# Patient Record
Sex: Female | Born: 1985
Health system: Southern US, Community
[De-identification: ages and names within clinical notes are randomized; demographics above are authoritative.]

## PROBLEM LIST (undated history)

## (undated) DIAGNOSIS — T7840XA Allergy, unspecified, initial encounter: Secondary | ICD-10-CM

## (undated) DIAGNOSIS — B019 Varicella without complication: Secondary | ICD-10-CM

## (undated) DIAGNOSIS — J302 Other seasonal allergic rhinitis: Secondary | ICD-10-CM

## (undated) DIAGNOSIS — R011 Cardiac murmur, unspecified: Secondary | ICD-10-CM

## (undated) DIAGNOSIS — F32A Depression, unspecified: Secondary | ICD-10-CM

## (undated) DIAGNOSIS — F329 Major depressive disorder, single episode, unspecified: Secondary | ICD-10-CM

## (undated) DIAGNOSIS — G44009 Cluster headache syndrome, unspecified, not intractable: Secondary | ICD-10-CM

## (undated) HISTORY — DX: Cluster headache syndrome, unspecified, not intractable: G44.009

## (undated) HISTORY — DX: Depression, unspecified: F32.A

## (undated) HISTORY — DX: Cardiac murmur, unspecified: R01.1

## (undated) HISTORY — PX: TONSILLECTOMY: SUR1361

## (undated) HISTORY — DX: Major depressive disorder, single episode, unspecified: F32.9

## (undated) HISTORY — DX: Varicella without complication: B01.9

## (undated) HISTORY — DX: Other seasonal allergic rhinitis: J30.2

## (undated) HISTORY — DX: Allergy, unspecified, initial encounter: T78.40XA

---

## 2008-09-20 HISTORY — PX: THERAPEUTIC ABORTION: SHX798

## 2010-04-29 ENCOUNTER — Ambulatory Visit: Payer: Self-pay | Admitting: Obstetrics & Gynecology

## 2010-04-29 LAB — CONVERTED CEMR LAB
HCV Ab: NEGATIVE
Hepatitis B Surface Ag: NEGATIVE

## 2010-04-30 ENCOUNTER — Encounter: Payer: Self-pay | Admitting: Obstetrics & Gynecology

## 2010-04-30 LAB — CONVERTED CEMR LAB
Trich, Wet Prep: NONE SEEN
Yeast Wet Prep HPF POC: NONE SEEN

## 2010-05-12 ENCOUNTER — Ambulatory Visit: Payer: Self-pay | Admitting: Otolaryngology

## 2011-02-02 NOTE — Assessment & Plan Note (Signed)
NAMELORENIA, Erica Bryan NO.:  [id number]   MEDICAL RECORD NO.:  [id number]          PATIENT TYPE:  POB   LOCATION:  [facility] at [facility]         FACILITY:  [facility]   PHYSICIAN:  Jaynie Collins, MD     DATE OF BIRTH:  May 18, 1986   DATE OF SERVICE:  04/29/2010                                  CLINIC NOTE   REASON FOR VISIT:  Annual gynecological exam.   HISTORY:  Ms. Rada is a 25 year old, gravida 1, para 0-0-1-0, who is  here today to establish gynecologic care and get her annual examination.  The patient has no gynecologic concerns.  She denies any abnormal  bleeding, vaginal discharge, pelvic pain, or any other medical concerns.   PAST OB/GYN HISTORY:  The patient had 1 termination in January 2010.   MENSTRUAL HISTORY:  She had menarche at age 63.  Her periods are regular  with 25 days in between her cycles.  Her periods last for 5 days with  some medium flow and mild pain.  No intermenstrual bleeding.  Her last  menstrual period was on April 23, 2010.  The patient currently uses  condoms for contraception and sexually transmitted infection prevention.  She has not gotten the Gardasil vaccination series, but denies any  history of abnormal Pap smears or sexually transmitted infections.   PAST MEDICAL HISTORY:  None.   PAST SURGICAL HISTORY:  Pregnancy termination.   MEDICATIONS:  None.   ALLERGIES:  SULFA.  The patient is not sure of the reaction to this  drug.   SOCIAL HISTORY:  The patient works as a Merchandiser, retail at [employer].  She  lives with her father.  She denies any tobacco, alcohol, or illicit drug  use.  She denies any past or current history of physical or sexual  abuse.   FAMILY HISTORY:  Unremarkable for diabetes and heart disease.  Her  mother also was diagnosed with stomach cancer.  She denies any cancer of  the breast, colon, ovaries, or uterus.   REVIEW OF SYSTEMS:  Remarkable for headache and cough which are caused  by seasonal allergies  and vaginal discharge, the patient reports having  a yellow greenish discharge for a few weeks that she notes  intermittently.   PHYSICAL EXAMINATION:  VITAL SIGNS:  Pulse is 96, blood pressure is  118/83, weight 140 pounds, and height 5 feet 6 inches.  GENERAL:  No apparent distress.  HEENT:  Normocephalic and atraumatic.  LUNGS:  Clear to auscultation bilaterally.  HEART:  Regular rate and rhythm.  NECK:  Supple.  Normal thyroid.  No masses.  ABDOMEN:  Soft, nontender, nondistended.  BREASTS:  Symmetric in size, soft, nontender.  No abnormal masses, skin  changes, nipple drainage, or lymphadenopathy noted.  EXTREMITIES:  No  cyanosis, clubbing, or edema.  PELVIC:  Normal external female genitalia, pink and well rugated vagina  normal, cervical contour.  Pap smear was obtained.  The patient did have  scant vaginal discharge, a sample was taken for wet prep.  On bimanual  exam, the patient has an anteverted and anteflexed, mobile uterus,  normal adnexa bilaterally.  Mild uterine tenderness on examination but  no cervical motion  tenderness or adnexal tenderness.   ASSESSMENT AND PLAN:  The patient is a 25 year old, gravida 1, para 0-0-  1-0, here for annual examination.  A Pap smear was obtained.  The  patient is interested in screening for sexually transmitted infections  and gonorrhea and Chlamydia will be screened from a Pap smear sample and  a wet prep will also be screened for Trichomonas.  She will have serum  tested for HIV, hep B, hep C, syphilis and will follow up on these  results.  The patient was informed about the Gardasil series.  She will  think about this and make an appointment to get this series as soon as  possible.  As for contraception, the patient wanted a prescription for  Loestrin 24 FE which was given to our she has used this in the past.  The patient was told to call or come back in for any further gynecologic  concerns.            ______________________________  Jaynie Collins, MD     UA/MEDQ  D:  04/29/2010  T:  04/30/2010  Job:  [id number]

## 2011-07-01 ENCOUNTER — Encounter: Payer: Self-pay | Admitting: Family Medicine

## 2011-07-01 ENCOUNTER — Ambulatory Visit (INDEPENDENT_AMBULATORY_CARE_PROVIDER_SITE_OTHER): Payer: BC Managed Care – PPO | Admitting: Family Medicine

## 2011-07-01 DIAGNOSIS — Z1272 Encounter for screening for malignant neoplasm of vagina: Secondary | ICD-10-CM

## 2011-07-01 DIAGNOSIS — IMO0001 Reserved for inherently not codable concepts without codable children: Secondary | ICD-10-CM | POA: Insufficient documentation

## 2011-07-01 DIAGNOSIS — Z113 Encounter for screening for infections with a predominantly sexual mode of transmission: Secondary | ICD-10-CM

## 2011-07-01 DIAGNOSIS — Z309 Encounter for contraceptive management, unspecified: Secondary | ICD-10-CM

## 2011-07-01 DIAGNOSIS — Z01419 Encounter for gynecological examination (general) (routine) without abnormal findings: Secondary | ICD-10-CM

## 2011-07-01 MED ORDER — NORETHIN ACE-ETH ESTRAD-FE 1-20 MG-MCG(24) PO TABS: 1.0000 | ORAL_TABLET | Freq: Every day | ORAL | Status: DC

## 2011-07-01 NOTE — Progress Notes (Signed)
  Subjective:     Erica Bryan is a 25 y.o. female and is here for a comprehensive physical exam. The patient reports no problems.  History   Social History  . Marital Status: Single    Spouse Name: N/A    Number of Children: N/A  . Years of Education: N/A   Occupational History  . Not on file.   Social History Main Topics  . Smoking status: Never Smoker   . Smokeless tobacco: Not on file  . Alcohol Use: No  . Drug Use: No  . Sexually Active: Yes    Birth Control/ Protection: Pill   Other Topics Concern  . Not on file   Social History Narrative  . No narrative on file   Health Maintenance  Topic Date Due  . Pap Smear  09/26/2002  . Tetanus/tdap  09/27/2003  . Influenza Vaccine  06/21/2011    The following portions of the patient's history were reviewed and updated as appropriate: allergies, current medications, past family history, past medical history, past social history, past surgical history and problem list.  Review of Systems A comprehensive review of systems was negative.   Objective:    BP 119/81  Pulse 101  Ht 5\' 5"  (1.651 m)  Wt 144 lb (65.318 kg)  BMI 23.96 kg/m2  LMP 06/15/2011 General appearance: alert, cooperative and appears stated age Head: Normocephalic, without obvious abnormality, atraumatic Neck: no adenopathy, supple, symmetrical, trachea midline and thyroid not enlarged, symmetric, no tenderness/mass/nodules Lungs: clear to auscultation bilaterally Breasts: normal appearance, no masses or tenderness, fibrocystic change at 12 o'clock left nipple. Heart: regular rate and rhythm, S1, S2 normal, no murmur, click, rub or gallop Abdomen: soft, non-tender; bowel sounds normal; no masses,  no organomegaly Pelvic: cervix normal in appearance, external genitalia normal, no adnexal masses or tenderness, no cervical motion tenderness, uterus normal size, shape, and consistency and vagina normal without discharge Extremities: extremities normal,  atraumatic, no cyanosis or edema Pulses: 2+ and symmetric Skin: Skin color, texture, turgor normal. No rashes or lesions Lymph nodes: Cervical, supraclavicular, and axillary nodes normal. Neurologic: Grossly normal    Assessment:    Healthy female exam.      Plan:    Pap smear Refill OC's. GC/Chlam See After Visit Summary for Counseling Recommendations

## 2011-07-01 NOTE — Progress Notes (Signed)
Patient here for physical and needs refill of Lo estrin.  She declines flu vaccine today.

## 2011-07-01 NOTE — Patient Instructions (Signed)

## 2012-07-11 ENCOUNTER — Ambulatory Visit (INDEPENDENT_AMBULATORY_CARE_PROVIDER_SITE_OTHER): Payer: BC Managed Care – PPO | Admitting: Obstetrics & Gynecology

## 2012-07-11 ENCOUNTER — Encounter: Payer: Self-pay | Admitting: Obstetrics & Gynecology

## 2012-07-11 VITALS — BP 112/86 | HR 110 | Ht 65.0 in | Wt 153.0 lb

## 2012-07-11 DIAGNOSIS — Z01419 Encounter for gynecological examination (general) (routine) without abnormal findings: Secondary | ICD-10-CM

## 2012-07-11 DIAGNOSIS — Z1151 Encounter for screening for human papillomavirus (HPV): Secondary | ICD-10-CM

## 2012-07-11 DIAGNOSIS — Z0000 Encounter for general adult medical examination without abnormal findings: Secondary | ICD-10-CM

## 2012-07-11 DIAGNOSIS — Z113 Encounter for screening for infections with a predominantly sexual mode of transmission: Secondary | ICD-10-CM

## 2012-07-11 DIAGNOSIS — Z124 Encounter for screening for malignant neoplasm of cervix: Secondary | ICD-10-CM

## 2012-07-11 MED ORDER — NORETHIN ACE-ETH ESTRAD-FE 1-20 MG-MCG(24) PO CHEW: 1.0000 | CHEWABLE_TABLET | Freq: Every morning | ORAL | Status: DC

## 2012-07-11 NOTE — Progress Notes (Signed)
Subjective:    Erica Bryan is a 26 y.o. female who presents for an annual exam. The patient has no complaints today. She wants a prescription for Minastrin. The patient is sexually active. GYN screening history: last pap: was normal. The patient wears seatbelts: yes. The patient participates in regular exercise: yes. Has the patient ever been transfused or tattooed?: yes. The patient reports that there is not domestic violence in her life.   Menstrual History: OB History    Grav Para Term Preterm Abortions TAB SAB Ect Mult Living   1    1           Menarche age: 89 Patient's last menstrual period was 06/20/2012.    The following portions of the patient's history were reviewed and updated as appropriate: allergies, current medications, past family history, past medical history, past social history, past surgical history and problem list.  Review of Systems A comprehensive review of systems was negative.  She is a Production designer, theatre/television/film at [employer]. She has been monogamous for 10 years and lives with her boyfriend. She declines a flu shot today. She denies dyspareunia.   Objective:    BP 112/86  Pulse 110  Ht 5\' 5"  (1.651 m)  Wt 153 lb (69.4 kg)  BMI 25.46 kg/m2  LMP 06/20/2012  General Appearance:    Alert, cooperative, no distress, appears stated age  Head:    Normocephalic, without obvious abnormality, atraumatic  Eyes:    PERRL, conjunctiva/corneas clear, EOM's intact, fundi    benign, both eyes  Ears:    Normal TM's and external ear canals, both ears  Nose:   Nares normal, septum midline, mucosa normal, no drainage    or sinus tenderness  Throat:   Lips, mucosa, and tongue normal; teeth and gums normal  Neck:   Supple, symmetrical, trachea midline, no adenopathy;    thyroid:  no enlargement/tenderness/nodules; no carotid   bruit or JVD  Back:     Symmetric, no curvature, ROM normal, no CVA tenderness  Lungs:     Clear to auscultation bilaterally, respirations unlabored  Chest Wall:     No tenderness or deformity   Heart:    Regular rate and rhythm, S1 and S2 normal, no murmur, rub   or gallop  Breast Exam:    No tenderness, masses, or nipple abnormality  Abdomen:     Soft, non-tender, bowel sounds active all four quadrants,    no masses, no organomegaly  Genitalia:    Normal female without lesion, discharge or tenderness, shaved, no lesions, NSSA, NT, mobile, no adnexal masses or tenderness     Extremities:   Extremities normal, atraumatic, no cyanosis or edema  Pulses:   2+ and symmetric all extremities  Skin:   Skin color, texture, turgor normal, no rashes or lesions  Lymph nodes:   Cervical, supraclavicular, and axillary nodes normal  Neurologic:   CNII-XII intact, normal strength, sensation and reflexes    throughout  .    Assessment:    Healthy female exam.    Plan:     Thin prep Pap smear.  I have recommended that she start a MVI as she may want a pregnancy in the near future.

## 2013-07-18 ENCOUNTER — Telehealth: Payer: Self-pay | Admitting: *Deleted

## 2013-07-18 DIAGNOSIS — IMO0001 Reserved for inherently not codable concepts without codable children: Secondary | ICD-10-CM

## 2013-07-18 MED ORDER — NORETHIN ACE-ETH ESTRAD-FE 1-20 MG-MCG(24) PO CHEW: 1.0000 | CHEWABLE_TABLET | Freq: Every morning | ORAL | Status: DC

## 2013-07-18 NOTE — Telephone Encounter (Signed)
Pharmacy is requesting refill of ocp.  Ok to refill two times and no further refills will be given as patient is due for her yearly exam.

## 2013-10-31 ENCOUNTER — Telehealth: Payer: Self-pay | Admitting: *Deleted

## 2013-10-31 DIAGNOSIS — IMO0001 Reserved for inherently not codable concepts without codable children: Secondary | ICD-10-CM

## 2013-10-31 MED ORDER — NORETHIN ACE-ETH ESTRAD-FE 1-20 MG-MCG(24) PO CHEW: 1.0000 | CHEWABLE_TABLET | Freq: Every morning | ORAL | Status: DC

## 2013-10-31 NOTE — Telephone Encounter (Signed)
Patient given one refill.  She needs to make appointment before further refills will be given.

## 2013-11-06 ENCOUNTER — Ambulatory Visit: Payer: BC Managed Care – PPO | Admitting: Family Medicine

## 2014-07-22 ENCOUNTER — Encounter: Payer: Self-pay | Admitting: Obstetrics & Gynecology

## 2017-08-10 ENCOUNTER — Ambulatory Visit: Payer: 59 | Admitting: Internal Medicine

## 2017-08-10 ENCOUNTER — Encounter: Payer: Self-pay | Admitting: Internal Medicine

## 2017-08-10 VITALS — BP 112/74 | HR 94 | Temp 98.5°F | Ht 66.0 in | Wt 183.4 lb

## 2017-08-10 DIAGNOSIS — Z13220 Encounter for screening for lipoid disorders: Secondary | ICD-10-CM | POA: Diagnosis not present

## 2017-08-10 DIAGNOSIS — R51 Headache: Secondary | ICD-10-CM | POA: Diagnosis not present

## 2017-08-10 DIAGNOSIS — H538 Other visual disturbances: Secondary | ICD-10-CM | POA: Diagnosis not present

## 2017-08-10 DIAGNOSIS — Z9109 Other allergy status, other than to drugs and biological substances: Secondary | ICD-10-CM | POA: Diagnosis not present

## 2017-08-10 DIAGNOSIS — F320 Major depressive disorder, single episode, mild: Secondary | ICD-10-CM | POA: Diagnosis not present

## 2017-08-10 DIAGNOSIS — Z1159 Encounter for screening for other viral diseases: Secondary | ICD-10-CM

## 2017-08-10 DIAGNOSIS — R519 Headache, unspecified: Secondary | ICD-10-CM | POA: Insufficient documentation

## 2017-08-10 HISTORY — DX: Major depressive disorder, single episode, mild: F32.0

## 2017-08-10 LAB — CBC WITH DIFFERENTIAL/PLATELET
BASOS ABS: 0 10*3/uL (ref 0.0–0.1)
Basophils Relative: 0.5 % (ref 0.0–3.0)
EOS ABS: 0.1 10*3/uL (ref 0.0–0.7)
Eosinophils Relative: 2.2 % (ref 0.0–5.0)
HEMATOCRIT: 40.1 % (ref 36.0–46.0)
HEMOGLOBIN: 13.5 g/dL (ref 12.0–15.0)
LYMPHS PCT: 25.6 % (ref 12.0–46.0)
Lymphs Abs: 1.2 10*3/uL (ref 0.7–4.0)
MCHC: 33.7 g/dL (ref 30.0–36.0)
MCV: 89.2 fl (ref 78.0–100.0)
MONO ABS: 0.2 10*3/uL (ref 0.1–1.0)
Monocytes Relative: 4.6 % (ref 3.0–12.0)
Neutro Abs: 3.1 10*3/uL (ref 1.4–7.7)
Neutrophils Relative %: 67.1 % (ref 43.0–77.0)
Platelets: 181 10*3/uL (ref 150.0–400.0)
RBC: 4.49 Mil/uL (ref 3.87–5.11)
RDW: 12.7 % (ref 11.5–15.5)
WBC: 4.6 10*3/uL (ref 4.0–10.5)

## 2017-08-10 LAB — COMPREHENSIVE METABOLIC PANEL
ALBUMIN: 4.5 g/dL (ref 3.5–5.2)
ALK PHOS: 53 U/L (ref 39–117)
ALT: 10 U/L (ref 0–35)
AST: 14 U/L (ref 0–37)
BILIRUBIN TOTAL: 0.7 mg/dL (ref 0.2–1.2)
BUN: 8 mg/dL (ref 6–23)
CALCIUM: 9.4 mg/dL (ref 8.4–10.5)
CO2: 28 mEq/L (ref 19–32)
CREATININE: 0.59 mg/dL (ref 0.40–1.20)
Chloride: 104 mEq/L (ref 96–112)
GFR: 124.86 mL/min (ref >60.00)
Glucose, Bld: 99 mg/dL (ref 70–99)
Potassium: 3.8 mEq/L (ref 3.5–5.1)
Sodium: 139 mEq/L (ref 135–145)
TOTAL PROTEIN: 6.8 g/dL (ref 6.0–8.3)

## 2017-08-10 LAB — LIPID PANEL
CHOLESTEROL: 166 mg/dL (ref 0–200)
HDL: 65.8 mg/dL (ref >39.00)
LDL Cholesterol: 88 mg/dL (ref 0–99)
NONHDL: 99.93
Total CHOL/HDL Ratio: 3
Triglycerides: 60 mg/dL (ref 0.0–149.0)
VLDL: 12 mg/dL (ref 0.0–40.0)

## 2017-08-10 LAB — TSH: TSH: 1.34 u[IU]/mL (ref 0.35–4.50)

## 2017-08-10 LAB — T4, FREE: FREE T4: 0.88 ng/dL (ref 0.60–1.60)

## 2017-08-10 MED ORDER — BUTALBITAL-APAP-CAFFEINE 50-325-40 MG PO TABS: 1.0000 | ORAL_TABLET | Freq: Two times a day (BID) | ORAL | 0 refills | Status: AC | PRN

## 2017-08-10 MED ORDER — ONDANSETRON HCL 4 MG PO TABS: 4.0000 mg | ORAL_TABLET | Freq: Three times a day (TID) | ORAL | 0 refills | Status: DC | PRN

## 2017-08-10 MED ORDER — NORTRIPTYLINE HCL 10 MG PO CAPS: 10.0000 mg | ORAL_CAPSULE | Freq: Every day | ORAL | 1 refills | Status: DC

## 2017-08-10 NOTE — Progress Notes (Signed)
Chief Complaint  Patient presents with  . Establish Care  . Headache  . Depression   New patient here with best friend of 20+ years female  1. C/o depression since age 31 y.o PHQ 9 score 5 today. No meds tried  2. C/o migraines x 4-5 years and vision blurry when has h/a and pressure behind either right or left eye. Denies FH migraines, aura, Trigger is weather change and h/o allergies and sinus issues. NSAIDS have not helped in the past. Pain is dull ache at times and lasts < or = 24 hours. Sound and light bother her. She also tried Excedrine migraine which helps sometimes and soda with caffeine and hot compress which helps. When she 1st got h/as 4-5 years ago left vision was blurry and she felt dizzy. Sleeping 6-8 hrs qhs. H/a assoc with n/v at times. Caffeine intake 1 soda x 20 oz. 1 cup tea and monster drinks. Max h/a pain score is 6/10 and has had 3x/month since Oct. Trigger is work stress     Review of Systems  Respiratory: Negative for shortness of breath.   Cardiovascular: Negative for chest pain.  Gastrointestinal: Negative for abdominal pain.  Skin: Negative for rash.  Neurological: Negative for dizziness and headaches.  Psychiatric/Behavioral: Positive for depression.   Past Medical History:  Diagnosis Date  . Allergy   . Chicken pox   . Depression   . Headaches, cluster   . Heart murmur   . Seasonal allergies    Past Surgical History:  Procedure Laterality Date  . THERAPEUTIC ABORTION  09/2008  . TONSILLECTOMY     2011   Family History  Problem Relation Age of Onset  . Cancer Mother        stomach  . Diabetes Father   . Hypertension Father   . COPD Father   . Depression Father   . Diabetes Brother   . Hypertension Maternal Grandmother   . Hyperlipidemia Maternal Grandmother   . Heart disease Maternal Grandfather   . Diabetes Paternal Grandfather    Social History   Socioeconomic History  . Marital status: Single    Spouse name: Not on file  . Number of  children: Not on file  . Years of education: Not on file  . Highest education level: Some college, no degree  Social Needs  . Financial resource strain: Not on file  . Food insecurity - worry: Not on file  . Food insecurity - inability: Not on file  . Transportation needs - medical: Not on file  . Transportation needs - non-medical: Not on file  Occupational History  . Occupation: [employer]    Comment: supervisor   Tobacco Use  . Smoking status: Never Smoker  . Smokeless tobacco: Never Used  Substance and Sexual Activity  . Alcohol use: Yes    Comment: social  . Drug use: No  . Sexual activity: Yes    Partners: Male    Birth control/protection: Pill  Other Topics Concern  . Not on file  Social History Narrative   Pt went to [employer] for some college x 2 years    No kids   Sexually active with men    Supervisor at [employer]    No outpatient medications have been marked as taking for the 08/10/17 encounter (Office Visit) with McLean-Scocuzza, Pasty Spillersracy N, MD.   Allergies  Allergen Reactions  . Sulfa Antibiotics     Unsure of reaction, she was very little   Vitals:  08/10/17 0850  Weight: 183 lb 6 oz (83.2 kg)  Height: 5\' 6"  (1.676 m)   Vitals:   08/10/17 0850  BP: 112/74  Pulse: 94  Temp: 98.5 F (36.9 C)  SpO2: 99%   Objective  Physical Exam  Constitutional: She is oriented to person, place, and time and well-developed, well-nourished, and in no distress. Vital signs are normal.  HENT:  Head: Normocephalic and atraumatic.  Mouth/Throat: Oropharynx is clear and moist and mucous membranes are normal.  Eyes: Conjunctivae are normal. Pupils are equal, round, and reactive to light.  Cardiovascular: Normal rate, regular rhythm and normal heart sounds.  Neg leg edema b/l   Pulmonary/Chest: Effort normal and breath sounds normal.  Abdominal: Soft. Bowel sounds are normal.  Neurological: She is alert and oriented to person, place, and time. She has normal motor skills. She  displays no weakness. No cranial nerve deficit.  CN 2-12 grossly intact  Nl motor strength 5/5 upper and lower ext b/l  Neg sensory changes   Skin: Skin is warm, dry and intact.  Psychiatric: Mood, memory, affect and judgment normal.  Nursing note and vitals reviewed.  Assessment   1. H/a possibly migraines vs tension associated with h/o blurry vision and dizziness  2. Depression mild PHQ 9 score 5 today  3. Allergies  4. HM  Plan  1. Failed NSAIDS  Trial nortriptyline 10 mg qhs and prn Fiorocet  Prn Zofran  MRI brain  rec get eye exam in future pt wants to hold  2. See above med addition  3. Will disc dx sinus/allergies at f/u if needed  4.  Pt had flu shot already Pt will sch pap at f/u and also Tdap  Disc HPV vx for future   Check Hep B status today  Declines STD check   Provider: Dr. French Anaracy McLean-Scocuzza

## 2017-08-10 NOTE — Patient Instructions (Signed)
1. Please follow up in 6 weeks for pap smear 2. We will refer you for MRI brain 3. Take Fiorcet as needed for h/a and Nortriptyline every night for h/a and mood  Nortriptyline capsules What is this medicine? NORTRIPTYLINE (nor TRIP ti leen) is used to treat depression. This medicine may be used for other purposes; ask your health care provider or pharmacist if you have questions. COMMON BRAND NAME(S): Aventyl, Pamelor What should I tell my health care provider before I take this medicine? They need to know if you have any of these conditions: -an alcohol problem -bipolar disorder or schizophrenia -difficulty passing urine, prostate trouble -glaucoma -heart disease or recent heart attack -liver disease -over active thyroid -seizures -thoughts or plans of suicide or a previous suicide attempt or family history of suicide attempt -an unusual or allergic reaction to nortriptyline, other medicines, foods, dyes, or preservatives -pregnant or trying to get pregnant -breast-feeding How should I use this medicine? Take this medicine by mouth with a glass of water. Follow the directions on the prescription label. Take your doses at regular intervals. Do not take it more often than directed. Do not stop taking this medicine suddenly except upon the advice of your doctor. Stopping this medicine too quickly may cause serious side effects or your condition may worsen. A special MedGuide will be given to you by the pharmacist with each prescription and refill. Be sure to read this information carefully each time. Talk to your pediatrician regarding the use of this medicine in children. Special care may be needed. Overdosage: If you think you have taken too much of this medicine contact a poison control center or emergency room at once. NOTE: This medicine is only for you. Do not share this medicine with others. What if I miss a dose? If you miss a dose, take it as soon as you can. If it is almost time  for your next dose, take only that dose. Do not take double or extra doses. What may interact with this medicine? Do not take this medicine with any of the following medications: -arsenic trioxide -certain medicines medicines for irregular heart beat -cisapride -halofantrine -linezolid -MAOIs like Carbex, Eldepryl, Marplan, Nardil, and Parnate -methylene blue (injected into a vein) -other medicines for mental depression -phenothiazines like perphenazine, thioridazine and chlorpromazine -pimozide -probucol -procarbazine -sparfloxacin -St. John's Wort -ziprasidone This medicine may also interact with any of the following medications: -atropine and related drugs like hyoscyamine, scopolamine, tolterodine and others -barbiturate medicines for inducing sleep or treating seizures, such as phenobarbital -cimetidine -medicines for diabetes -medicines for seizures like carbamazepine or phenytoin -reserpine -thyroid medicine This list may not describe all possible interactions. Give your health care provider a list of all the medicines, herbs, non-prescription drugs, or dietary supplements you use. Also tell them if you smoke, drink alcohol, or use illegal drugs. Some items may interact with your medicine. What should I watch for while using this medicine? Tell your doctor if your symptoms do not get better or if they get worse. Visit your doctor or health care professional for regular checks on your progress. Because it may take several weeks to see the full effects of this medicine, it is important to continue your treatment as prescribed by your doctor. Patients and their families should watch out for new or worsening thoughts of suicide or depression. Also watch out for sudden changes in feelings such as feeling anxious, agitated, panicky, irritable, hostile, aggressive, impulsive, severely restless, overly excited and hyperactive, or  not being able to sleep. If this happens, especially at the  beginning of treatment or after a change in dose, call your health care professional. Bonita QuinYou may get drowsy or dizzy. Do not drive, use machinery, or do anything that needs mental alertness until you know how this medicine affects you. Do not stand or sit up quickly, especially if you are an older patient. This reduces the risk of dizzy or fainting spells. Alcohol may interfere with the effect of this medicine. Avoid alcoholic drinks. Do not treat yourself for coughs, colds, or allergies without asking your doctor or health care professional for advice. Some ingredients can increase possible side effects. Your mouth may get dry. Chewing sugarless gum or sucking hard candy, and drinking plenty of water may help. Contact your doctor if the problem does not go away or is severe. This medicine may cause dry eyes and blurred vision. If you wear contact lenses you may feel some discomfort. Lubricating drops may help. See your eye doctor if the problem does not go away or is severe. This medicine can cause constipation. Try to have a bowel movement at least every 2 to 3 days. If you do not have a bowel movement for 3 days, call your doctor or health care professional. This medicine can make you more sensitive to the sun. Keep out of the sun. If you cannot avoid being in the sun, wear protective clothing and use sunscreen. Do not use sun lamps or tanning beds/booths. What side effects may I notice from receiving this medicine? Side effects that you should report to your doctor or health care professional as soon as possible: -allergic reactions like skin rash, itching or hives, swelling of the face, lips, or tongue -anxious -breathing problems -changes in vision -confusion -elevated mood, decreased need for sleep, racing thoughts, impulsive behavior -eye pain -fast, irregular heartbeat -feeling faint or lightheaded, falls -feeling agitated, angry, or irritable -fever with increased sweating -hallucination,  loss of contact with reality -seizures -stiff muscles -suicidal thoughts or other mood changes -tingling, pain, or numbness in the feet or hands -trouble passing urine or change in the amount of urine -trouble sleeping -unusually weak or tired -vomiting -yellowing of the eyes or skin Side effects that usually do not require medical attention (report to your doctor or health care professional if they continue or are bothersome): -change in sex drive or performance -change in appetite or weight -constipation -dizziness -dry mouth -nausea -tired -tremors -upset stomach This list may not describe all possible side effects. Call your doctor for medical advice about side effects. You may report side effects to FDA at 1-800-FDA-1088. Where should I keep my medicine? Keep out of the reach of children. Store at room temperature between 15 and 30 degrees C (59 and 86 degrees F). Keep container tightly closed. Throw away any unused medicine after the expiration date. NOTE: This sheet is a summary. It may not cover all possible information. If you have questions about this medicine, talk to your doctor, pharmacist, or health care provider.  2018 Elsevier/Gold Standard (2016-02-06 12:53:08)  Major Depressive Disorder, Adult Major depressive disorder (MDD) is a mental health condition. MDD often makes you feel sad, hopeless, or helpless. MDD can also cause symptoms in your body. MDD can affect your:  Work.  School.  Relationships.  Other normal activities.  MDD can range from mild to very bad. It may occur once (single episode MDD). It can also occur many times (recurrent MDD). The main symptoms of  MDD often include:  Feeling sad, depressed, or irritable most of the time.  Loss of interest.  MDD symptoms also include:  Sleeping too much or too little.  Eating too much or too little.  A change in your weight.  Feeling tired (fatigue) or having low energy.  Feeling  worthless.  Feeling guilty.  Trouble making decisions.  Trouble thinking clearly.  Thoughts of suicide or harming others.  Feeling weak.  Feeling agitated.  Keeping yourself from being around other people (isolation).  Follow these instructions at home: Activity  Do these things as told by your doctor: ? Go back to your normal activities. ? Exercise regularly. ? Spend time outdoors. Alcohol  Talk with your doctor about how alcohol can affect your antidepressant medicines.  Do not drink alcohol. Or, limit how much alcohol you drink. ? This means no more than 1 drink a day for nonpregnant women and 2 drinks a day for men. One drink equals one of these:  12 oz of beer.  5 oz of wine.  1 oz of hard liquor. General instructions  Take over-the-counter and prescription medicines only as told by your doctor.  Eat a healthy diet.  Get plenty of sleep.  Find activities that you enjoy. Make time to do them.  Think about joining a support group. Your doctor may be able to suggest a group for you.  Keep all follow-up visits as told by your doctor. This is important. Where to find more information:  [employer] on Mental Illness: ? www.nami.org  [place] [employer] of Mental Health: ? http://www.maynard.net/  National Suicide Prevention Lifeline: ? 878-506-9752. This is free, 24-hour help. Contact a doctor if:  Your symptoms get worse.  You have new symptoms. Get help right away if:  You self-harm.  You see, hear, taste, smell, or feel things that are not present (hallucinate). If you ever feel like you may hurt yourself or others, or have thoughts about taking your own life, get help right away. You can go to your nearest emergency department or call:  Your local emergency services (911 in the [place]  A suicide crisis helpline, such as the National Suicide Prevention Lifeline: ? 480-439-4185. This is open 24 hours a day.  This information is not  intended to replace advice given to you by your health care provider. Make sure you discuss any questions you have with your health care provider. Document Released: 08/18/2015 Document Revised: 05/23/2016 Document Reviewed: 05/23/2016 Elsevier Interactive Patient Education  2017 Elsevier Inc.  General Headache Without Cause A headache is pain or discomfort felt around the head or neck area. The specific cause of a headache may not be found. There are many causes and types of headaches. A few common ones are:  Tension headaches.  Migraine headaches.  Cluster headaches.  Chronic daily headaches.  Follow these instructions at home: Watch your condition for any changes. Take these steps to help with your condition: Managing pain  Take over-the-counter and prescription medicines only as told by your health care provider.  Lie down in a dark, quiet room when you have a headache.  If directed, apply ice to the head and neck area: ? Put ice in a plastic bag. ? Place a towel between your skin and the bag. ? Leave the ice on for 20 minutes, 2-3 times per day.  Use a heating pad or hot shower to apply heat to the head and neck area as told by your health care provider.  Keep lights dim if bright lights bother you or make your headaches worse. Eating and drinking  Eat meals on a regular schedule.  Limit alcohol use.  Decrease the amount of caffeine you drink, or stop drinking caffeine. General instructions  Keep all follow-up visits as told by your health care provider. This is important.  Keep a headache journal to help find out what may trigger your headaches. For example, write down: ? What you eat and drink. ? How much sleep you get. ? Any change to your diet or medicines.  Try massage or other relaxation techniques.  Limit stress.  Sit up straight, and do not tense your muscles.  Do not use tobacco products, including cigarettes, chewing tobacco, or e-cigarettes. If  you need help quitting, ask your health care provider.  Exercise regularly as told by your health care provider.  Sleep on a regular schedule. Get 7-9 hours of sleep, or the amount recommended by your health care provider. Contact a health care provider if:  Your symptoms are not helped by medicine.  You have a headache that is different from the usual headache.  You have nausea or you vomit.  You have a fever. Get help right away if:  Your headache becomes severe.  You have repeated vomiting.  You have a stiff neck.  You have a loss of vision.  You have problems with speech.  You have pain in the eye or ear.  You have muscular weakness or loss of muscle control.  You lose your balance or have trouble walking.  You feel faint or pass out.  You have confusion. This information is not intended to replace advice given to you by your health care provider. Make sure you discuss any questions you have with your health care provider. Document Released: 09/06/2005 Document Revised: 02/12/2016 Document Reviewed: 12/30/2014 Elsevier Interactive Patient Education  2017 [employer].

## 2017-08-11 LAB — HEPATITIS B SURFACE ANTIBODY, QUANTITATIVE: HEPATITIS B-POST: 101 m[IU]/mL (ref >=10)

## 2017-08-17 LAB — POCT URINALYSIS DIPSTICK
BILIRUBIN UA: NEGATIVE
Glucose, UA: NEGATIVE
Ketones, UA: NEGATIVE
NITRITE UA: NEGATIVE
PH UA: 7 (ref 5.0–8.0)
Protein, UA: NEGATIVE
Spec Grav, UA: 1.015 (ref 1.010–1.025)
Urobilinogen, UA: 0.2 E.U./dL

## 2017-08-17 NOTE — Addendum Note (Signed)
Addended by: Warden FillersWRIGHT, LATOYA S on: 08/17/2017 10:18 AM   Modules accepted: Orders

## 2017-08-18 ENCOUNTER — Ambulatory Visit
Admission: RE | Admit: 2017-08-18 | Discharge: 2017-08-18 | Disposition: A | Discharge status: Home / Self Care | Payer: 59 | Source: Ambulatory Visit | Attending: Internal Medicine | Admitting: Internal Medicine

## 2017-08-18 DIAGNOSIS — R42 Dizziness and giddiness: Secondary | ICD-10-CM | POA: Diagnosis not present

## 2017-08-18 DIAGNOSIS — R600 Localized edema: Secondary | ICD-10-CM | POA: Diagnosis not present

## 2017-08-18 DIAGNOSIS — R51 Headache: Secondary | ICD-10-CM | POA: Diagnosis not present

## 2017-08-18 DIAGNOSIS — R519 Headache, unspecified: Secondary | ICD-10-CM

## 2018-08-12 IMAGING — MR MR HEAD W/O CM
10 series · 48 of 48 positions shown · non-contrast
Comparison: None.

CLINICAL DATA: Chronic non intractable headache. Blurred vision and
dizziness

EXAM:
MRI HEAD WITHOUT CONTRAST
TECHNIQUE: Multiplanar, multiecho pulse sequences of the brain and surrounding
structures were obtained without intravenous contrast.

[Series 2: T1 · sagittal · 5.0mm · 0.45mm/px · 2 of 27 slices shown (1 of 2)]
[im 1/27]
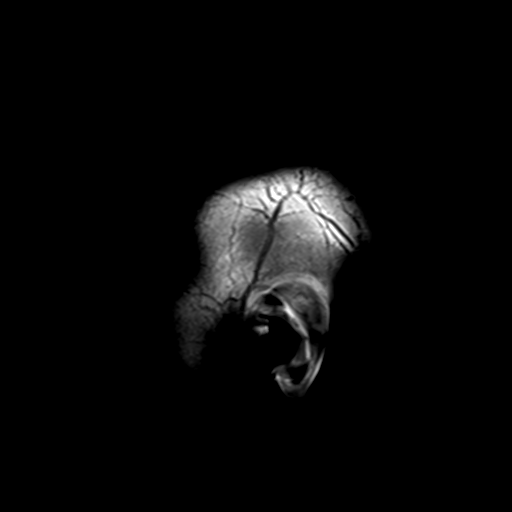
[im 27/27]
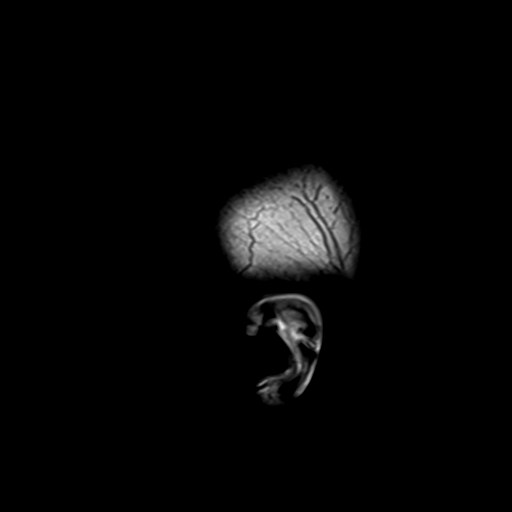

[Series 4: DWI · axial · 3.0mm · 1.80mm/px · z∈[-53,+108]mm · 5 of 54 slices shown (1 of 2)]
[im 1/54]
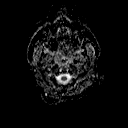
[im 14/54]
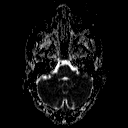
[im 27/54]
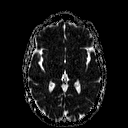
[im 40/54]
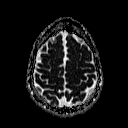
[im 54/54]
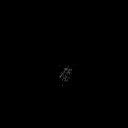

[Series 6: DWI · coronal · 3.0mm · 1.80mm/px · 4 of 49 slices shown (2 of 2)]
[im 1/49]
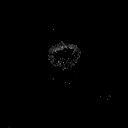
[im 17/49]
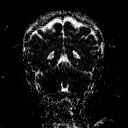
[im 33/49]
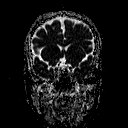
[im 49/49]
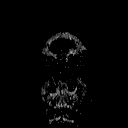

[Series 7: T2 · axial · 5.0mm · 0.60mm/px · z∈[-50,+106]mm · 2 of 25 slices shown (1 of 3)]
[im 1/25]
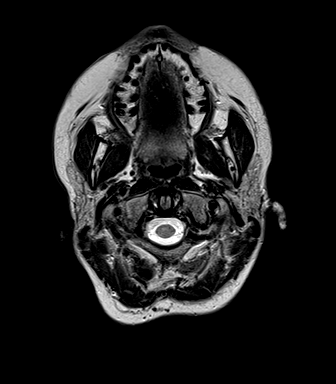
[im 25/25]
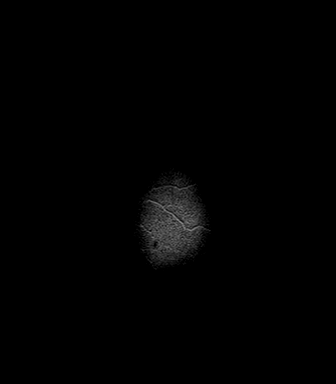

[Series 8: FLAIR · axial · 3.0mm · 0.45mm/px · z∈[-50,+106]mm · 5 of 53 slices shown]
[im 1/53]
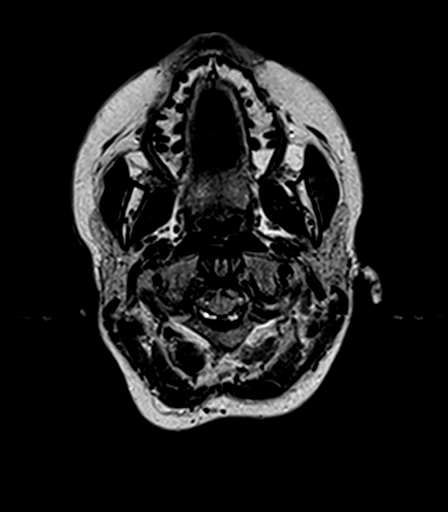
[im 14/53]
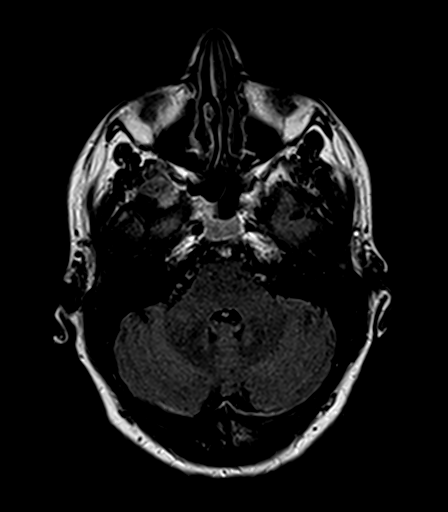
[im 27/53]
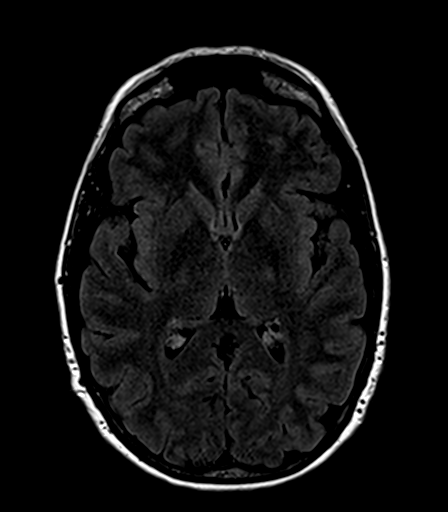
[im 40/53]
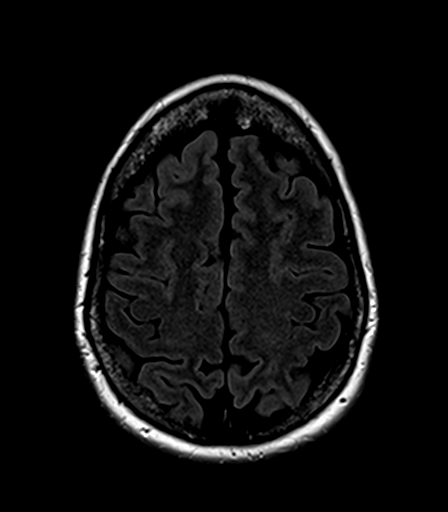
[im 53/53]
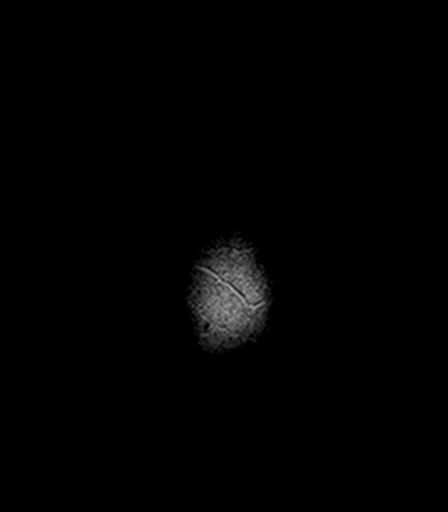

[Series 9: T2 · axial · 5.0mm · 0.45mm/px · z∈[-50,+106]mm · 2 of 25 slices shown (2 of 3)]
[im 1/25]
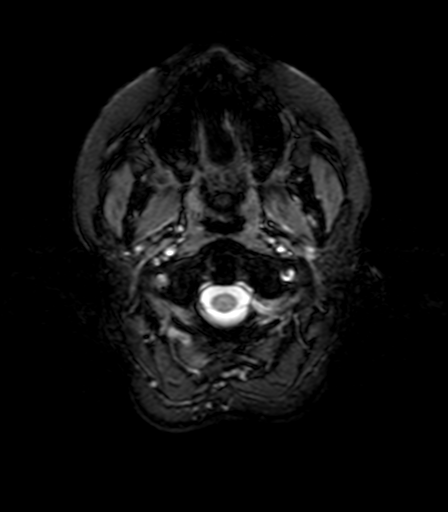
[im 25/25]
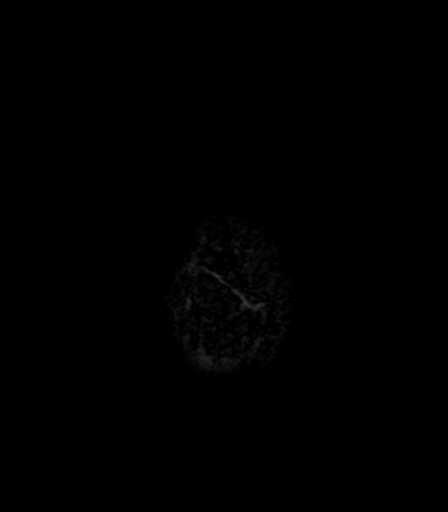

[Series 10: T1 · axial · 1.0mm · 1.00mm/px · z∈[-58,+116]mm · 16 of 176 slices shown (2 of 2)]
[im 1/176]
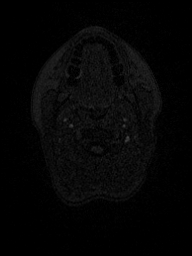
[im 12/176]
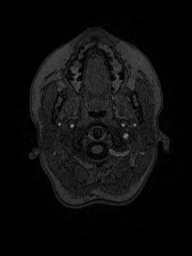
[im 24/176]
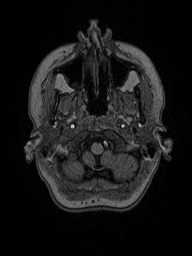
[im 36/176]
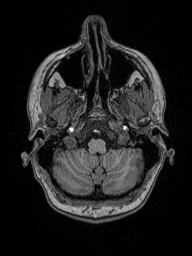
[im 47/176]
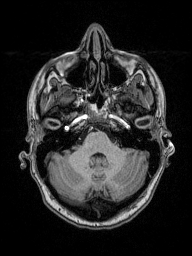
[im 59/176]
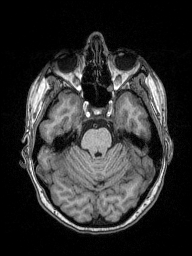
[im 71/176]
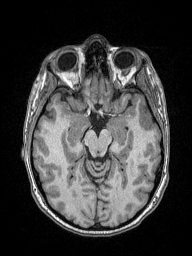
[im 82/176]
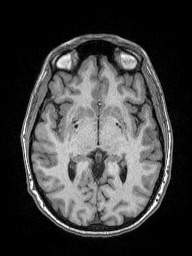
[im 94/176]
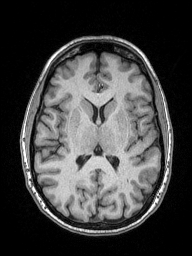
[im 106/176]
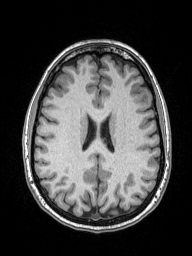
[im 117/176]
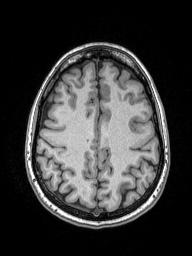
[im 129/176]
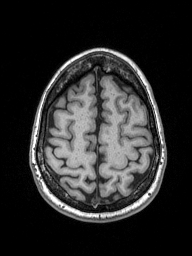
[im 141/176]
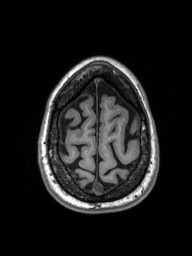
[im 152/176]
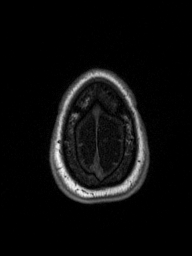
[im 164/176]
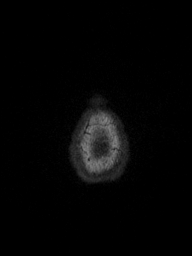
[im 176/176]
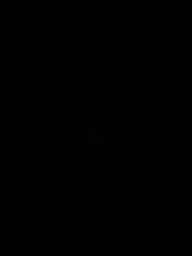

[Series 11: T2 · coronal · 5.0mm · 0.49mm/px · 3 of 30 slices shown (3 of 3)]
[im 1/30]
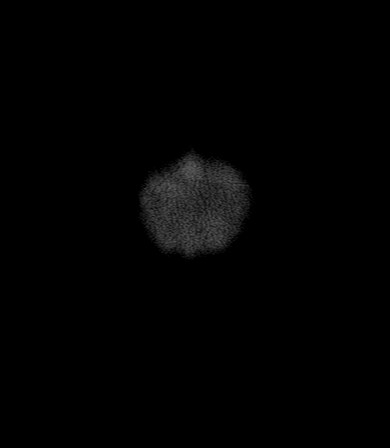
[im 15/30]
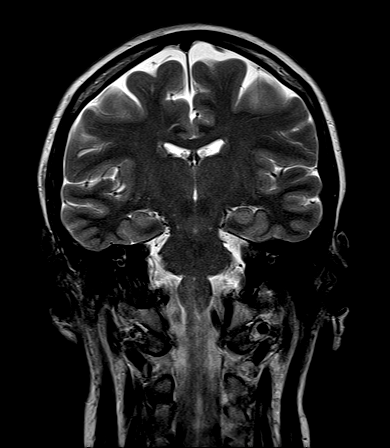
[im 30/30]
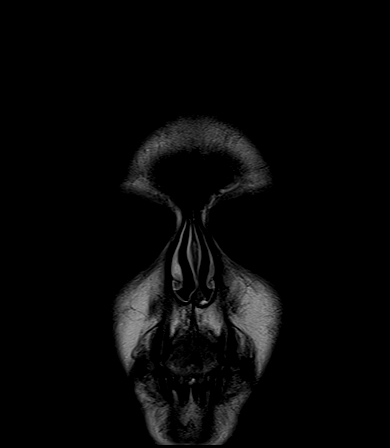

[Series 100: b (id) ax · axial · 3.0mm · 1.80mm/px · z∈[-53,+108]mm · 5 of 55 slices shown]
[im 1/55]
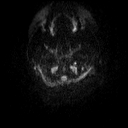
[im 14/55]
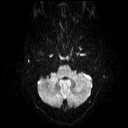
[im 28/55]
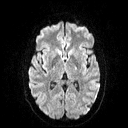
[im 41/55]
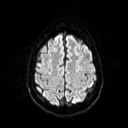
[im 55/55]
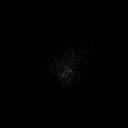

[Series 101: b (id) cor · coronal · 3.0mm · 1.80mm/px · 4 of 47 slices shown]
[im 1/47]
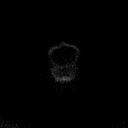
[im 16/47]
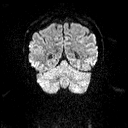
[im 31/47]
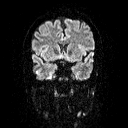
[im 47/47]
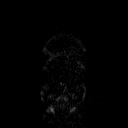

[48 of 48 positions shown; findings below may reference images not displayed]

FINDINGS: Brain: No acute infarction, hemorrhage, hydrocephalus, extra-axial
collection or mass lesion.

Vascular: Normal arterial flow voids.

Skull and upper cervical spine: Negative

Sinuses/Orbits: Mild mucosal edema paranasal sinuses.  Normal orbit

Other: None
IMPRESSION: Normal MRI brain without contrast

Mild mucosal edema paranasal sinuses.

## 2019-02-27 ENCOUNTER — Ambulatory Visit (INDEPENDENT_AMBULATORY_CARE_PROVIDER_SITE_OTHER): Payer: 59 | Admitting: Obstetrics & Gynecology

## 2019-02-27 ENCOUNTER — Other Ambulatory Visit: Payer: Self-pay

## 2019-02-27 ENCOUNTER — Encounter: Payer: Self-pay | Admitting: Obstetrics & Gynecology

## 2019-02-27 VITALS — BP 138/91 | HR 120 | Ht 66.0 in | Wt 176.0 lb

## 2019-02-27 DIAGNOSIS — Z124 Encounter for screening for malignant neoplasm of cervix: Secondary | ICD-10-CM | POA: Diagnosis not present

## 2019-02-27 DIAGNOSIS — Z1151 Encounter for screening for human papillomavirus (HPV): Secondary | ICD-10-CM | POA: Diagnosis not present

## 2019-02-27 DIAGNOSIS — N898 Other specified noninflammatory disorders of vagina: Secondary | ICD-10-CM

## 2019-02-27 DIAGNOSIS — Z113 Encounter for screening for infections with a predominantly sexual mode of transmission: Secondary | ICD-10-CM

## 2019-02-27 DIAGNOSIS — Z01419 Encounter for gynecological examination (general) (routine) without abnormal findings: Secondary | ICD-10-CM

## 2019-02-27 MED ORDER — METRONIDAZOLE 500 MG PO TABS: 500.0000 mg | ORAL_TABLET | Freq: Two times a day (BID) | ORAL | 0 refills | Status: DC

## 2019-02-27 MED ORDER — NORGESTREL-ETHINYL ESTRADIOL 0.3-30 MG-MCG PO TABS: 1.0000 | ORAL_TABLET | Freq: Every day | ORAL | 11 refills | Status: AC

## 2019-02-27 NOTE — Progress Notes (Addendum)
Subjective:    Erica Bryan is a 33 y.o. single P0  female who presents for an annual exam. She found a lump in the right side of her vulva/vagina about a week ago. It itches. She has not used any OTC meds.  The patient is not currently sexually active for about 7 months. GYN screening history: last pap: was normal. The patient wears seatbelts: yes. The patient participates in regular exercise: no. Has the patient ever been transfused or tattooed?: yes. The patient reports that there is not domestic violence in her life.   Menstrual History: OB History    Gravida  1   Para      Term      Preterm      AB  1   Living        SAB      TAB      Ectopic      Multiple      Live Births              Menarche age: 75 Patient's last menstrual period was 01/21/2019 (exact date).    The following portions of the patient's history were reviewed and updated as appropriate: allergies, current medications, past family history, past medical history, past social history, past surgical history and problem list.  Review of Systems Pertinent items are noted in HPI.   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 [employer]   Objective:    BP (!) 138/91   Pulse (!) 120   Ht 5\' 6"  (1.676 m)   Wt 176 lb (79.8 kg)   LMP 01/21/2019 (Exact Date)   BMI 28.41 kg/m   General Appearance:    Alert, cooperative, no distress, appears stated age  Head:    Normocephalic, without obvious abnormality, atraumatic  Eyes:    PERRL, conjunctiva/corneas clear, EOM's intact, fundi    benign, both eyes  Ears:    Normal TM's and external ear canals, both ears  Nose:   Nares normal, septum midline, mucosa normal, no drainage    or sinus tenderness  Throat:   Lips, mucosa, and tongue normal; teeth and gums normal  Neck:   Supple, symmetrical, trachea midline, no adenopathy;    thyroid:  no enlargement/tenderness/nodules; no carotid   bruit or JVD  Back:     Symmetric,  no curvature, ROM normal, no CVA tenderness  Lungs:     Clear to auscultation bilaterally, respirations unlabored  Chest Wall:    No tenderness or deformity   Heart:    Regular rate and rhythm, S1 and S2 normal, no murmur, rub   or gallop  Breast Exam:    No tenderness, masses, or nipple abnormality  Abdomen:     Soft, non-tender, bowel sounds active all four quadrants,    no masses, no organomegaly  Genitalia:    Normal female without lesion or tenderness, deep skin lesion on the right side of her mons (c/w black head), normal size and shape, anteverted, mobile, non-tender, normal adnexal exam, vaginal discharge c/w BV      Extremities:   Extremities normal, atraumatic, no cyanosis or edema  Pulses:   2+ and symmetric all extremities  Skin:   Skin color, texture, turgor normal, no rashes or lesions  Lymph nodes:   Cervical, supraclavicular, and axillary nodes normal  Neurologic:   CNII-XII intact, normal strength, sensation and reflexes    throughout  .    Assessment:  Healthy female exam.   Desire for contraception Plan:     Thin prep Pap smear. with cotesting STI testing per patient request including HSV Treat with flagyl Wet prep sent Start Lo ovral with first day of NMP, rec back up for a month and condoms always for STI prevention.

## 2019-02-27 NOTE — Progress Notes (Signed)
Pt found a lump on right side of vagina  Last PAP around 7 years ago STD testing  Would like rx for OCP's

## 2019-02-27 NOTE — Addendum Note (Signed)
Addended by: Emily Filbert on: 02/27/2019 03:41 PM   Modules accepted: Orders

## 2019-02-28 LAB — HEPATITIS C ANTIBODY: Hep C Virus Ab: <0.1 s/co ratio (ref 0.0–0.9)

## 2019-02-28 LAB — HSV 2 ANTIBODY, IGG: HSV 2 IgG, Type Spec: <0.91 index (ref 0.00–0.90)

## 2019-02-28 LAB — RPR: RPR Ser Ql: NONREACTIVE

## 2019-02-28 LAB — HEPATITIS B SURFACE ANTIGEN: Hepatitis B Surface Ag: NEGATIVE

## 2019-02-28 LAB — HIV ANTIBODY (ROUTINE TESTING W REFLEX): HIV Screen 4th Generation wRfx: NONREACTIVE

## 2019-03-02 LAB — CERVICOVAGINAL ANCILLARY ONLY
Bacterial vaginitis: POSITIVE — AB
Candida vaginitis: NEGATIVE
Trichomonas: NEGATIVE

## 2019-03-04 LAB — CYTOLOGY - PAP
Chlamydia: NEGATIVE
Diagnosis: UNDETERMINED — AB
HPV 16/18/45 genotyping: NEGATIVE
HPV: DETECTED — AB
Neisseria Gonorrhea: NEGATIVE

## 2019-03-06 ENCOUNTER — Telehealth: Payer: Self-pay | Admitting: *Deleted

## 2019-03-06 NOTE — Telephone Encounter (Signed)
-----   Message from Emily Filbert, MD sent at 03/05/2019 11:35 AM EDT ----- She will need a colpo. I sent her a message.

## 2019-03-06 NOTE — Telephone Encounter (Signed)
Called pt to make sure she saw her results and message from Dr Hulan Fray. Pt did and will go ahead and make appointment for a colpo.

## 2019-03-12 ENCOUNTER — Telehealth: Payer: Self-pay | Admitting: *Deleted

## 2019-03-12 NOTE — Telephone Encounter (Signed)
Attempted to return pt call from nurse line.

## 2019-03-22 ENCOUNTER — Ambulatory Visit (INDEPENDENT_AMBULATORY_CARE_PROVIDER_SITE_OTHER): Payer: 59 | Admitting: Obstetrics & Gynecology

## 2019-03-22 ENCOUNTER — Encounter: Payer: Self-pay | Admitting: *Deleted

## 2019-03-22 ENCOUNTER — Other Ambulatory Visit: Payer: Self-pay

## 2019-03-22 ENCOUNTER — Encounter: Payer: Self-pay | Admitting: Obstetrics & Gynecology

## 2019-03-22 VITALS — BP 130/82 | HR 94 | Wt 178.0 lb

## 2019-03-22 DIAGNOSIS — Z3202 Encounter for pregnancy test, result negative: Secondary | ICD-10-CM

## 2019-03-22 DIAGNOSIS — N870 Mild cervical dysplasia: Secondary | ICD-10-CM | POA: Diagnosis not present

## 2019-03-22 DIAGNOSIS — R87810 Cervical high risk human papillomavirus (HPV) DNA test positive: Secondary | ICD-10-CM | POA: Insufficient documentation

## 2019-03-22 DIAGNOSIS — R87610 Atypical squamous cells of undetermined significance on cytologic smear of cervix (ASC-US): Secondary | ICD-10-CM | POA: Insufficient documentation

## 2019-03-22 LAB — POCT URINE PREGNANCY: Preg Test, Ur: NEGATIVE

## 2019-03-22 NOTE — Progress Notes (Signed)
    GYNECOLOGY CLINIC COLPOSCOPY PROCEDURE NOTE  33 y.o. G1P0010 here for colposcopy for ASCUS with POSITIVE high risk HPV pap smear on 02/27/2019. Discussed role for HPV in cervical dysplasia, need for surveillance.  Patient given informed consent, signed copy in the chart, time out was performed.  Placed in lithotomy position. Cervix viewed with speculum and colposcope after application of acetic acid.   Colposcopy adequate? Yes Acetowhite lesion(s) noted at 12 and 6 o'clock; corresponding biopsies obtained.  ECC specimen obtained. All specimens were labeled and sent to pathology.  Patient was given post procedure instructions.  Will follow up pathology and manage accordingly; patient will be contacted with results and recommendations.  Routine preventative health maintenance measures emphasized.   Verita Schneiders, MD, [place] for [facility], [place]

## 2019-03-22 NOTE — Patient Instructions (Signed)

## 2019-03-26 ENCOUNTER — Encounter: Payer: Self-pay | Admitting: Obstetrics & Gynecology

## 2019-03-26 DIAGNOSIS — N870 Mild cervical dysplasia: Secondary | ICD-10-CM | POA: Insufficient documentation

## 2021-10-16 ENCOUNTER — Emergency Department (HOSPITAL_BASED_OUTPATIENT_CLINIC_OR_DEPARTMENT_OTHER): Payer: BC Managed Care – PPO

## 2021-10-16 ENCOUNTER — Other Ambulatory Visit: Payer: Self-pay

## 2021-10-16 ENCOUNTER — Encounter (HOSPITAL_BASED_OUTPATIENT_CLINIC_OR_DEPARTMENT_OTHER): Payer: Self-pay

## 2021-10-16 ENCOUNTER — Emergency Department (HOSPITAL_BASED_OUTPATIENT_CLINIC_OR_DEPARTMENT_OTHER)
Admission: EM | Admit: 2021-10-16 | Discharge: 2021-10-16 | Disposition: A | Discharge status: Home / Self Care | Payer: BC Managed Care – PPO | Attending: Emergency Medicine | Admitting: Emergency Medicine

## 2021-10-16 DIAGNOSIS — R0602 Shortness of breath: Secondary | ICD-10-CM | POA: Insufficient documentation

## 2021-10-16 DIAGNOSIS — Z20822 Contact with and (suspected) exposure to covid-19: Secondary | ICD-10-CM | POA: Diagnosis not present

## 2021-10-16 LAB — CBC WITH DIFFERENTIAL/PLATELET
Abs Immature Granulocytes: 0.06 10*3/uL (ref 0.00–0.07)
Basophils Absolute: 0 10*3/uL (ref 0.0–0.1)
Basophils Relative: 0 %
Eosinophils Absolute: 0 10*3/uL (ref 0.0–0.5)
Eosinophils Relative: 0 %
HCT: 42.5 % (ref 36.0–46.0)
Hemoglobin: 14.9 g/dL (ref 12.0–15.0)
Immature Granulocytes: 0 %
Lymphocytes Relative: 5 %
Lymphs Abs: 0.7 10*3/uL (ref 0.7–4.0)
MCH: 30.2 pg (ref 26.0–34.0)
MCHC: 35.1 g/dL (ref 30.0–36.0)
MCV: 86.2 fL (ref 80.0–100.0)
Monocytes Absolute: 0.5 10*3/uL (ref 0.1–1.0)
Monocytes Relative: 3 %
Neutro Abs: 13.6 10*3/uL — ABNORMAL HIGH (ref 1.7–7.7)
Neutrophils Relative %: 92 %
Platelets: 270 10*3/uL (ref 150–400)
RBC: 4.93 MIL/uL (ref 3.87–5.11)
RDW: 12 % (ref 11.5–15.5)
WBC: 14.8 10*3/uL — ABNORMAL HIGH (ref 4.0–10.5)
nRBC: 0 % (ref 0.0–0.2)

## 2021-10-16 LAB — RESP PANEL BY RT-PCR (FLU A&B, COVID) ARPGX2
Influenza A by PCR: NEGATIVE
Influenza B by PCR: NEGATIVE
SARS Coronavirus 2 by RT PCR: NEGATIVE

## 2021-10-16 LAB — COMPREHENSIVE METABOLIC PANEL
ALT: 14 U/L (ref 0–44)
AST: 14 U/L — ABNORMAL LOW (ref 15–41)
Albumin: 4.4 g/dL (ref 3.5–5.0)
Alkaline Phosphatase: 56 U/L (ref 38–126)
Anion gap: 11 (ref 5–15)
BUN: 11 mg/dL (ref 6–20)
CO2: 19 mmol/L — ABNORMAL LOW (ref 22–32)
Calcium: 9.3 mg/dL (ref 8.9–10.3)
Chloride: 105 mmol/L (ref 98–111)
Creatinine, Ser: 0.66 mg/dL (ref 0.44–1.00)
GFR, Estimated: >60 mL/min (ref >60)
Glucose, Bld: 122 mg/dL — ABNORMAL HIGH (ref 70–99)
Potassium: 4.1 mmol/L (ref 3.5–5.1)
Sodium: 135 mmol/L (ref 135–145)
Total Bilirubin: 0.8 mg/dL (ref 0.3–1.2)
Total Protein: 7.6 g/dL (ref 6.5–8.1)

## 2021-10-16 LAB — TROPONIN I (HIGH SENSITIVITY): Troponin I (High Sensitivity): <2 ng/L (ref <18)

## 2021-10-16 LAB — D-DIMER, QUANTITATIVE: D-Dimer, Quant: 0.71 ug/mL-FEU — ABNORMAL HIGH (ref 0.00–0.50)

## 2021-10-16 LAB — HCG, SERUM, QUALITATIVE: Preg, Serum: NEGATIVE

## 2021-10-16 MED ORDER — ALBUTEROL SULFATE HFA 108 (90 BASE) MCG/ACT IN AERS: 1.0000 | INHALATION_SPRAY | Freq: Four times a day (QID) | RESPIRATORY_TRACT | 0 refills | Status: AC | PRN

## 2021-10-16 MED ORDER — SODIUM CHLORIDE 0.9 % IV BOLUS: 1000.0000 mL | Freq: Once | INTRAVENOUS | Status: DC

## 2021-10-16 MED ORDER — IOHEXOL 350 MG/ML SOLN
75.0000 mL | Freq: Once | INTRAVENOUS | Status: AC | PRN
  Administered 2021-10-16: 75 mL via INTRAVENOUS

## 2021-10-16 NOTE — ED Notes (Signed)
Patient transported to CT 

## 2021-10-16 NOTE — ED Provider Notes (Signed)
Emergency Department Provider Note   I have reviewed the triage vital signs and the nursing notes.   HISTORY  Chief Complaint Shortness of Breath   HPI TAYLI PLACZEK is a 36 y.o. female presents to the ED with SOB after potential COVID exposure. Patient never tested positive for COVID. Denies CP, pleuritic or otherwise. No fever. No abdominal pain or GI symptoms. SOB is worse with exertion. No syncope.    Past Medical History:  Diagnosis Date   Allergy    Chicken pox    Depression    Depression, major, single episode, mild ([place]) 08/10/2017   Headaches, cluster    Heart murmur    Seasonal allergies     Review of Systems  Constitutional: No fever/chills Eyes: No visual changes. ENT: No sore throat. Cardiovascular: Denies chest pain. Respiratory: Positive shortness of breath. Gastrointestinal: No abdominal pain.  No nausea, no vomiting.  No diarrhea.  No constipation. Genitourinary: Negative for dysuria. Musculoskeletal: Negative for back pain. Skin: Negative for rash. Neurological: Negative for headaches, focal weakness or numbness.  ____________________________________________   PHYSICAL EXAM:  VITAL SIGNS: ED Triage Vitals  Enc Vitals Group     BP 10/16/21 1056 (!) 145/89     Pulse Rate 10/16/21 1056 (!) 116     Resp 10/16/21 1056 18     Temp 10/16/21 1056 98.3 F (36.8 C)     Temp Source 10/16/21 1056 Oral     SpO2 10/16/21 1056 97 %     Weight 10/16/21 1059 175 lb (79.4 kg)     Height 10/16/21 1059 5\' 5"  (1.651 m)   Constitutional: Alert and oriented. Well appearing and in no acute distress. Eyes: Conjunctivae are normal.  Head: Atraumatic. Nose: No congestion/rhinnorhea. Mouth/Throat: Mucous membranes are moist.  Neck: No stridor.   Cardiovascular: Normal rate, regular rhythm. Good peripheral circulation. Grossly normal heart sounds.   Respiratory: Normal respiratory effort.  No retractions. Lungs CTAB. Gastrointestinal: Soft and nontender.  No distention.  Musculoskeletal: No lower extremity tenderness nor edema. No gross deformities of extremities. Neurologic:  Normal speech and language.  Skin:  Skin is warm, dry and intact. No rash noted.  ____________________________________________   LABS (all labs ordered are listed, but only abnormal results are displayed)  Labs Reviewed  COMPREHENSIVE METABOLIC PANEL - Abnormal; Notable for the following components:      Result Value   CO2 19 (*)    Glucose, Bld 122 (*)    AST 14 (*)    All other components within normal limits  CBC WITH DIFFERENTIAL/PLATELET - Abnormal; Notable for the following components:   WBC 14.8 (*)    Neutro Abs 13.6 (*)    All other components within normal limits  D-DIMER, QUANTITATIVE - Abnormal; Notable for the following components:   D-Dimer, Quant 0.71 (*)    All other components within normal limits  RESP PANEL BY RT-PCR (FLU A&B, COVID) ARPGX2  HCG, SERUM, QUALITATIVE  TROPONIN I (HIGH SENSITIVITY)   ____________________________________________  EKG   EKG Interpretation  Date/Time:  Friday October 16 2021 11:04:01 EST Ventricular Rate:  104 PR Interval:  157 QRS Duration: 84 QT Interval:  356 QTC Calculation: 469 R Axis:   96 Text Interpretation: Sinus tachycardia Probable left atrial enlargement Borderline right axis deviation Low voltage, precordial leads Borderline T abnormalities, anterior leads Confirmed by Nanda Quinton [id number]) on 10/16/2021 11:05:27 AM        ____________________________________________   PROCEDURES  Procedure(s) performed:   Procedures  None ____________________________________________   INITIAL IMPRESSION / ASSESSMENT AND PLAN / ED COURSE  Pertinent labs & imaging results that were available during my care of the patient were reviewed by me and considered in my medical decision making (see chart for details).   This patient is Presenting for Evaluation of SOB, which does require a range of  treatment options, and is a complaint that involves a high risk of morbidity and mortality.  The Differential Diagnoses includes all life-threatening causes for chest pain/SOB. This includes but is not exclusive to acute coronary syndrome, aortic dissection, pulmonary embolism, cardiac tamponade, community-acquired pneumonia, pericarditis, musculoskeletal chest wall pain, etc.   Critical Interventions- cardiac monitoring, labs, EKG   Medications  iohexol (OMNIPAQUE) 350 MG/ML injection 75 mL (75 mLs Intravenous Contrast Given 10/16/21 1308)    Reassessment after intervention:  Patient with unchanged symptoms. Normal O2 sats.    I decided to review pertinent External Data, and in summary No recent ED or PCP visits.    Clinical Laboratory Tests Ordered, included COVID and Flu PCR negative. Troponin negative. D dimer elevated to 0.71.   Radiologic Tests Ordered, included CXR and CTA. I independently interpreted the images and agree with radiology interpretation.   Cardiac Monitor Tracing which shows sinus tachycardia.    Social Determinants of Health Risk patient is a non-smoker.   Medical Decision Making: Summary:  Patient with exertional SOB. No CP. Question post-viral process. CTA with no evidence of CAP or PE. No exam findings to suspect new decompensated CHF.   Reevaluation with update and discussion with patient. Plan for PCP and Cardiology follow up. May ultimately need Pulmonology follow up as well but will discuss with PCP. Discussed supportive care plan and ED return precautions.   Disposition: discharge  ____________________________________________  FINAL CLINICAL IMPRESSION(S) / ED DIAGNOSES  Final diagnoses:  SOB (shortness of breath)     NEW OUTPATIENT MEDICATIONS STARTED DURING THIS VISIT:  Discharge Medication List as of 10/16/2021  2:20 PM     START taking these medications   Details  albuterol (VENTOLIN HFA) 108 (90 Base) MCG/ACT inhaler Inhale 1-2 puffs  into the lungs every 6 (six) hours as needed for wheezing or shortness of breath., Starting Fri 10/16/2021, Normal        Note:  This document was prepared using Dragon voice recognition software and may include unintentional dictation errors.  Nanda Quinton, MD, [facility]    Evin Loiseau, [facility], MD 10/23/21 [phone]

## 2021-10-16 NOTE — Discharge Instructions (Signed)
Seen in the emergency room today with trouble breathing.  Your lab test here are reassuring.  We did a CT scan of your lungs which did not show evidence of a blood clot or other lung abnormality.  I am starting you on albuterol inhaler to use as needed for shortness of breath.  Please take as directed.  Please call your primary care doctor for follow-up appointment in the coming week and return to the emergency department any new or suddenly worsening symptoms.

## 2021-10-16 NOTE — ED Triage Notes (Signed)
Pt arrives with c/o SOB X2 weeks states that she has been covid tested because she was around someone who was positive however her Covid test was negative. Pt states that she was placed on an antibiotic and steroid, she finished both of these, and since finishing reports her symptoms have gotten worse.
